# Patient Record
Sex: Female | Born: 1955 | Race: White | Hispanic: No | Smoking: Never smoker
Health system: Southern US, Community
[De-identification: ages and names within clinical notes are randomized; demographics above are authoritative.]

## PROBLEM LIST (undated history)

## (undated) DIAGNOSIS — J45909 Unspecified asthma, uncomplicated: Secondary | ICD-10-CM

## (undated) DIAGNOSIS — M8580 Other specified disorders of bone density and structure, unspecified site: Secondary | ICD-10-CM

## (undated) DIAGNOSIS — C801 Malignant (primary) neoplasm, unspecified: Secondary | ICD-10-CM

## (undated) HISTORY — PX: BUNIONECTOMY: SHX129

## (undated) HISTORY — PX: MASTECTOMY: SHX3

## (undated) HISTORY — PX: ABDOMINOPLASTY: SUR9

---

## 2003-03-24 ENCOUNTER — Emergency Department (HOSPITAL_COMMUNITY): Admission: EM | Admit: 2003-03-24 | Discharge: 2003-03-25 | Payer: Self-pay | Admitting: Emergency Medicine

## 2004-06-02 ENCOUNTER — Other Ambulatory Visit: Admission: RE | Admit: 2004-06-02 | Discharge: 2004-06-02 | Payer: Self-pay | Admitting: Gynecology

## 2013-06-08 ENCOUNTER — Emergency Department (HOSPITAL_COMMUNITY)
Admission: EM | Admit: 2013-06-08 | Discharge: 2013-06-08 | Disposition: A | Discharge status: Home / Self Care | Payer: BC Managed Care – PPO | Attending: Emergency Medicine | Admitting: Emergency Medicine

## 2013-06-08 ENCOUNTER — Encounter (HOSPITAL_COMMUNITY): Payer: Self-pay | Admitting: Emergency Medicine

## 2013-06-08 DIAGNOSIS — R5381 Other malaise: Secondary | ICD-10-CM | POA: Insufficient documentation

## 2013-06-08 DIAGNOSIS — J45909 Unspecified asthma, uncomplicated: Secondary | ICD-10-CM | POA: Insufficient documentation

## 2013-06-08 DIAGNOSIS — Z79899 Other long term (current) drug therapy: Secondary | ICD-10-CM | POA: Insufficient documentation

## 2013-06-08 DIAGNOSIS — M25539 Pain in unspecified wrist: Secondary | ICD-10-CM | POA: Insufficient documentation

## 2013-06-08 DIAGNOSIS — R5383 Other fatigue: Secondary | ICD-10-CM | POA: Insufficient documentation

## 2013-06-08 DIAGNOSIS — Z853 Personal history of malignant neoplasm of breast: Secondary | ICD-10-CM | POA: Insufficient documentation

## 2013-06-08 HISTORY — DX: Malignant (primary) neoplasm, unspecified: C80.1

## 2013-06-08 HISTORY — DX: Unspecified asthma, uncomplicated: J45.909

## 2013-06-08 LAB — COMPREHENSIVE METABOLIC PANEL
Albumin: 4.2 g/dL (ref 3.5–5.2)
Alkaline Phosphatase: 67 U/L (ref 39–117)
BUN: 17 mg/dL (ref 6–23)
Chloride: 103 mEq/L (ref 96–112)
GFR calc Af Amer: 89 mL/min — ABNORMAL LOW (ref >90)
Glucose, Bld: 85 mg/dL (ref 70–99)
Potassium: 4.9 mEq/L (ref 3.5–5.1)
Total Bilirubin: 0.4 mg/dL (ref 0.3–1.2)

## 2013-06-08 LAB — CBC WITH DIFFERENTIAL/PLATELET
Hemoglobin: 15 g/dL (ref 12.0–15.0)
Lymphs Abs: 1.7 10*3/uL (ref 0.7–4.0)
Monocytes Relative: 9 % (ref 3–12)
Neutro Abs: 1.4 10*3/uL — ABNORMAL LOW (ref 1.7–7.7)
Neutrophils Relative %: 39 % — ABNORMAL LOW (ref 43–77)
RBC: 4.89 MIL/uL (ref 3.87–5.11)

## 2013-06-08 MED ORDER — AMOXICILLIN 500 MG PO CAPS: 500.0000 mg | ORAL_CAPSULE | Freq: Three times a day (TID) | ORAL | Status: DC

## 2013-06-08 MED ORDER — ONDANSETRON 8 MG PO TBDP
8.0000 mg | ORAL_TABLET | Freq: Three times a day (TID) | ORAL | Status: DC | PRN
Start: 2013-06-08 — End: 2019-09-23

## 2013-06-08 MED ORDER — DOXYCYCLINE HYCLATE 100 MG PO CAPS: 100.0000 mg | ORAL_CAPSULE | Freq: Two times a day (BID) | ORAL | Status: DC

## 2013-06-08 MED ORDER — ONDANSETRON 8 MG PO TBDP: 8.0000 mg | ORAL_TABLET | Freq: Three times a day (TID) | ORAL | Status: DC | PRN

## 2013-06-08 NOTE — ED Provider Notes (Signed)
CSN: [id number]     Arrival date & time 06/08/13  1228 History     First MD Initiated Contact with Patient 06/08/13 1402     No chief complaint on file. CC: Positive RMSF titre   (Consider location/radiation/quality/duration/timing/severity/associated sxs/prior Treatment) HPI Comments: Patient presents with complaint of fatigue and arthralgias for the past 5 weeks. Patient complains of mainly pain in her bilateral wrists when she wakes in the morning. She saw her primary care doctor was concerned about Lyme disease given the fact that she was recently in [place]. Patient did note a "bite mark" on her abdomen several weeks ago but never developed a rash. She never saw a tick. Patient had an equivocal titer for [facility] spotted fever. She had negative titers for Lyme and ehrlichiosis. Patient was prescribed doxycycline 2 days ago took 1 dose but stopped after she became nauseous. No fever. The onset of this condition was gradual. The course is constant. Aggravating factors: none. Alleviating factors: none.    The history is provided by the patient.    Past Medical History  Diagnosis Date  . Cancer     breast ca  . Asthma     exercised induced   Past Surgical History  Procedure Laterality Date  . Mastectomy     No family history on file. History  Substance Use Topics  . Smoking status: Never Smoker   . Smokeless tobacco: Not on file  . Alcohol Use: Yes   OB History   Grav Para Term Preterm Abortions TAB SAB Ect Mult Living                 Review of Systems  Constitutional: Positive for fatigue. Negative for fever.  HENT: Negative for sore throat and rhinorrhea.   Eyes: Negative for redness.  Respiratory: Negative for cough.   Cardiovascular: Negative for chest pain.  Gastrointestinal: Negative for nausea, vomiting, abdominal pain and diarrhea.  Genitourinary: Negative for dysuria.  Musculoskeletal: Positive for arthralgias. Negative for myalgias.  Skin: Negative  for rash.  Neurological: Negative for headaches.    Allergies  Doxycycline and Percocet  Home Medications   Current Outpatient Rx  Name  Route  Sig  Dispense  Refill  . anastrozole (ARIMIDEX) 1 MG tablet   Oral   Take 1 mg by mouth daily.          BP 132/87  Pulse 75  Temp(Src) 97.6 F (36.4 C) (Oral)  Resp 99  SpO2 99% Physical Exam  Nursing note and vitals reviewed. Constitutional: She appears well-developed and well-nourished.  HENT:  Head: Normocephalic and atraumatic.  Eyes: Conjunctivae are normal. Right eye exhibits no discharge. Left eye exhibits no discharge.  Neck: Normal range of motion. Neck supple.  Cardiovascular: Normal rate, regular rhythm and normal heart sounds.   Pulmonary/Chest: Effort normal and breath sounds normal.  Abdominal: Soft. There is no tenderness.  Musculoskeletal:  Full ROM in hands and wrists with mild pain with movement.   Neurological: She is alert.  Skin: Skin is warm and dry.  No rash.  Psychiatric: She has a normal mood and affect.    ED Course   Procedures (including critical care time)  Labs Reviewed  CBC WITH DIFFERENTIAL - Abnormal; Notable for the following:    WBC 3.6 (*)    Neutrophils Relative % 39 (*)    Neutro Abs 1.4 (*)    Lymphocytes Relative 47 (*)    All other components within normal limits  COMPREHENSIVE METABOLIC  PANEL - Abnormal; Notable for the following:    GFR calc non Af Amer 77 (*)    GFR calc Af Amer 89 (*)    All other components within normal limits   No results found. 1. Fatigue   2. Wrist pain, unspecified laterality     2:49 PM Patient seen and examined. Pt d/w Dr. Karma Ganja.   Vital signs reviewed and are as follows: Filed Vitals:   06/08/13 1236  BP: 132/87  Pulse: 75  Temp: 97.6 F (36.4 C)  Resp: 99   Discussed need for PCP f/u. Informed of lab results.   Discussed treatment of doxycycline for 7 days total (cover for RMSF) vs 28 days for Lyme. She wants to treat for 28  days. She will f/u with PCP, possible rheumatologist.   Patient urged to return with worsening symptoms or other concerns. Patient verbalized understanding and agrees with plan.     MDM  RMSF titre IgM 0.94, equivocal. No rash or active disease other than joint pains. She has been in [place] so Lyme is possible. She wants to treat for Lyme even with neg western blot knowing that could be false neg. She wants to try taking doxy with zofran. I also provided 30 day rx amox if she cannot tolerate with zofran. Pt appears well, non-toxic, appropriate for d/c.    Renne Crigler, PA-C 06/08/13 1546

## 2013-06-08 NOTE — ED Notes (Addendum)
Aches in hands and feet x a 5 of weeks ago saw a bite mark around 70 of July. Went to dr [facility] 2 days ago and had [place] spotted fever titer drawn and started on doxy took one. and today  They called and told gher it was positive Had bouts of vomiting has been weak and tired alot

## 2013-06-08 NOTE — ED Provider Notes (Signed)
Medical screening examination/treatment/procedure(s) were performed by non-physician practitioner and as supervising physician I was immediately available for consultation/collaboration.  Ethelda Chick, MD 06/08/13 [phone]

## 2018-11-02 HISTORY — PX: KNEE ARTHROSCOPY W/ MENISCAL REPAIR: SHX1877

## 2019-09-23 ENCOUNTER — Other Ambulatory Visit: Payer: Self-pay

## 2019-09-23 ENCOUNTER — Emergency Department (HOSPITAL_COMMUNITY)
Admission: EM | Admit: 2019-09-23 | Discharge: 2019-09-23 | Disposition: A | Discharge status: Home / Self Care | Payer: 59 | Attending: Emergency Medicine | Admitting: Emergency Medicine

## 2019-09-23 ENCOUNTER — Emergency Department (HOSPITAL_COMMUNITY): Payer: 59

## 2019-09-23 ENCOUNTER — Encounter (HOSPITAL_COMMUNITY): Payer: Self-pay | Admitting: Obstetrics and Gynecology

## 2019-09-23 DIAGNOSIS — Y939 Activity, unspecified: Secondary | ICD-10-CM | POA: Insufficient documentation

## 2019-09-23 DIAGNOSIS — J45909 Unspecified asthma, uncomplicated: Secondary | ICD-10-CM | POA: Insufficient documentation

## 2019-09-23 DIAGNOSIS — S199XXA Unspecified injury of neck, initial encounter: Secondary | ICD-10-CM | POA: Diagnosis present

## 2019-09-23 DIAGNOSIS — G44301 Post-traumatic headache, unspecified, intractable: Secondary | ICD-10-CM | POA: Diagnosis not present

## 2019-09-23 DIAGNOSIS — Z79899 Other long term (current) drug therapy: Secondary | ICD-10-CM | POA: Insufficient documentation

## 2019-09-23 DIAGNOSIS — S161XXA Strain of muscle, fascia and tendon at neck level, initial encounter: Secondary | ICD-10-CM | POA: Diagnosis not present

## 2019-09-23 DIAGNOSIS — Y92410 Unspecified street and highway as the place of occurrence of the external cause: Secondary | ICD-10-CM | POA: Diagnosis not present

## 2019-09-23 DIAGNOSIS — Y999 Unspecified external cause status: Secondary | ICD-10-CM | POA: Insufficient documentation

## 2019-09-23 HISTORY — DX: Other specified disorders of bone density and structure, unspecified site: M85.80

## 2019-09-23 MED ORDER — IBUPROFEN 200 MG PO TABS
600.0000 mg | ORAL_TABLET | Freq: Once | ORAL | Status: AC
  Administered 2019-09-23: 11:00:00 600 mg via ORAL
  Filled 2019-09-23: qty 3

## 2019-09-23 MED ORDER — METHOCARBAMOL 500 MG PO TABS: 500.0000 mg | ORAL_TABLET | Freq: Two times a day (BID) | ORAL | 0 refills | Status: DC
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
## 2022-03-11 LAB — COLOGUARD: COLOGUARD: NEGATIVE

## 2022-03-16 ENCOUNTER — Other Ambulatory Visit: Payer: Self-pay | Admitting: Internal Medicine

## 2022-03-16 DIAGNOSIS — M85859 Other specified disorders of bone density and structure, unspecified thigh: Secondary | ICD-10-CM

## 2022-09-10 ENCOUNTER — Ambulatory Visit
Admission: RE | Admit: 2022-09-10 | Discharge: 2022-09-10 | Disposition: A | Discharge status: Home / Self Care | Payer: Medicare Other | Source: Ambulatory Visit | Attending: Internal Medicine | Admitting: Internal Medicine

## 2022-09-10 DIAGNOSIS — M85859 Other specified disorders of bone density and structure, unspecified thigh: Secondary | ICD-10-CM

## 2022-10-28 NOTE — Progress Notes (Deleted)
66 y.o. No obstetric history on file. Married Caucasian female here for NEW GYN/ annual exam.    PCP:     No LMP recorded. Patient is postmenopausal.           Sexually active: {yes no:314532}  The current method of family planning is post menopausal status.    Exercising: {yes no:314532}  {types:19826} Smoker:  {YES NO:22349}  Health Maintenance: Pap:  *** History of abnormal Pap:  {YES NO:22349} MMG:  *** Colonoscopy:  *** BMD:   09/10/22  Result  osteopenic TDaP:  *** Gardasil:   {YES NO:22349} HIV: Hep C: Screening Labs:  Hb today: ***, Urine today: ***   reports that she has never smoked. She does not have any smokeless tobacco history on file. She reports current alcohol use of about 7.0 standard drinks of alcohol per week. She reports that she does not currently use drugs.  Past Medical History:  Diagnosis Date   Asthma    exercised induced   Cancer ([place])    breast ca   Osteopenia     Past Surgical History:  Procedure Laterality Date   MASTECTOMY      Current Outpatient Medications  Medication Sig Dispense Refill   methocarbamol (ROBAXIN) 500 MG tablet Take 1 tablet (500 mg total) by mouth 2 (two) times daily. 20 tablet 0   No current facility-administered medications for this visit.    No family history on file.  Review of Systems  Exam:   There were no vitals taken for this visit.    General appearance: alert, cooperative and appears stated age Head: normocephalic, without obvious abnormality, atraumatic Neck: no adenopathy, supple, symmetrical, trachea midline and thyroid normal to inspection and palpation Lungs: clear to auscultation bilaterally Breasts: normal appearance, no masses or tenderness, No nipple retraction or dimpling, No nipple discharge or bleeding, No axillary adenopathy Heart: regular rate and rhythm Abdomen: soft, non-tender; no masses, no organomegaly Extremities: extremities normal, atraumatic, no cyanosis or edema Skin: skin  color, texture, turgor normal. No rashes or lesions Lymph nodes: cervical, supraclavicular, and axillary nodes normal. Neurologic: grossly normal  Pelvic: External genitalia:  no lesions              No abnormal inguinal nodes palpated.              Urethra:  normal appearing urethra with no masses, tenderness or lesions              Bartholins and Skenes: normal                 Vagina: normal appearing vagina with normal color and discharge, no lesions              Cervix: no lesions              Pap taken: {yes no:314532} Bimanual Exam:  Uterus:  normal size, contour, position, consistency, mobility, non-tender              Adnexa: no mass, fullness, tenderness              Rectal exam: {yes no:314532}.  Confirms.              Anus:  normal sphincter tone, no lesions  Chaperone was present for exam:  ***  Assessment:   Well woman visit with gynecologic exam.   Plan: Mammogram screening discussed. Self breast awareness reviewed. Pap and HR HPV as above. Guidelines for Calcium, Vitamin D, regular exercise program including  cardiovascular and weight bearing exercise.   Follow up annually and prn.   Additional counseling given.  {yes [id number]. _______ minutes face to face time of which over 50% was spent in counseling.    After visit summary provided.

## 2022-11-10 ENCOUNTER — Encounter: Payer: Medicare Other | Admitting: Obstetrics and Gynecology

## 2022-11-19 NOTE — Progress Notes (Unsigned)
67 y.o. No obstetric history on file. Married Caucasian female here for NEW GYN/annual exam.  Wants to discuss estrogen creams and recurrent UTI's. Has discomfort in her pelvis with this occurrences.  No dysuria and no hematuria.  Using probiotic and product like D-Mannose. Also having yeast infection.   New sexual partner after 12 years.  Having UTIs now. Well  Hx breast cancer.   Using vaginal lubricants with intercourse.   Interested in HRT.   Retired.  Has grandchildren in [place].   PCP:   Mickel Baas While, MD  No LMP recorded. Patient is postmenopausal.           Sexually active: Yes.    The current method of family planning is post menopausal status.    Exercising: Yes.     High intensity workouts, bike, hiking, walking  6x a week Smoker:  no  Health Maintenance: Pap:  2023 per pt History of abnormal Pap:  no MMG:  2023 per pt, [facility] the [facility] Colonoscopy:  n/a, cologuard 2023 BMD:   09/10/22  Result  osteopenic TDaP:  unsure, getting it soon for travel Gardasil:   no Screening Labs:  PCP   reports that she has never smoked. She does not have any smokeless tobacco history on file. She reports current alcohol use of about 7.0 standard drinks of alcohol per week. She reports that she does not currently use drugs.  Past Medical History:  Diagnosis Date   Asthma    exercised induced   Cancer ([place])    breast ca   Osteopenia     Past Surgical History:  Procedure Laterality Date   BUNIONECTOMY     2 of them   KNEE ARTHROSCOPY W/ MENISCAL REPAIR  2020   MASTECTOMY      Current Outpatient Medications  Medication Sig Dispense Refill   Probiotic Product (PROBIOTIC PO) Take by mouth.     rosuvastatin (CRESTOR) 10 MG tablet Take 10 mg by mouth at bedtime.     No current facility-administered medications for this visit.    Family History  Problem Relation Age of Onset   Breast cancer Maternal Aunt     Review of Systems  SEE HPI.    Exam:   BP 116/72 (BP Location: Left Arm, Patient Position: Sitting, Cuff Size: Normal)   Pulse 64   Ht '5\' 2"'$  (1.575 m)   Wt 127 lb (57.6 kg)   SpO2 100%   BMI 23.23 kg/m     General appearance: alert, cooperative and appears stated age Head: normocephalic, without obvious abnormality, atraumatic Neck: no adenopathy, supple, symmetrical, trachea midline and thyroid normal to inspection and palpation Lungs: clear to auscultation bilaterally Breasts: normal appearance, no masses or tenderness, No nipple retraction or dimpling, No nipple discharge or bleeding, No axillary adenopathy Heart: regular rate and rhythm Abdomen: soft, non-tender; no masses, no organomegaly Extremities: extremities normal, atraumatic, no cyanosis or edema Skin: skin color, texture, turgor normal. No rashes or lesions Lymph nodes: cervical, supraclavicular, and axillary nodes normal. Neurologic: grossly normal  Pelvic: External genitalia:  slits between labia majora and minora.               No abnormal inguinal nodes palpated.              Urethra:  normal appearing urethra with no masses, tenderness or lesions              Bartholins and Skenes: normal  Vagina: normal appearing vagina with normal color and discharge, no lesions              Cervix: no lesions              Pap taken: no Bimanual Exam:  Uterus:  normal size, contour, position, consistency, mobility, non-tender              Adnexa: no mass, fullness, tenderness              Rectal exam: yes.  Confirms.              Anus:  normal sphincter tone, no lesions  Chaperone was present for exam:  Raquel Sarna  Assessment:   Well woman visit with gynecologic exam. Hx breast cancer.  Post coital UTI.  STD screening.   Plan: Mammogram screening discussed. Self breast awareness reviewed. Pap and HR HPV not indicated.  Guidelines for Calcium, Vitamin D, regular exercise program including cardiovascular and weight bearing exercise.FU in  June for recheck.   Follow up annually and prn.   Additional counseling given.  {yes [id number]. _______ minutes face to face time of which over 50% was spent in counseling.    After visit summary provided.

## 2022-12-01 ENCOUNTER — Other Ambulatory Visit (HOSPITAL_COMMUNITY)
Admission: RE | Admit: 2022-12-01 | Discharge: 2022-12-01 | Disposition: A | Discharge status: Home / Self Care | Payer: Medicare Other | Source: Ambulatory Visit | Attending: Obstetrics and Gynecology | Admitting: Obstetrics and Gynecology

## 2022-12-01 ENCOUNTER — Encounter: Payer: Self-pay | Admitting: Obstetrics and Gynecology

## 2022-12-01 ENCOUNTER — Ambulatory Visit (INDEPENDENT_AMBULATORY_CARE_PROVIDER_SITE_OTHER): Payer: Medicare Other | Admitting: Obstetrics and Gynecology

## 2022-12-01 VITALS — BP 116/72 | HR 64 | Ht 62.0 in | Wt 127.0 lb

## 2022-12-01 DIAGNOSIS — Z9189 Other specified personal risk factors, not elsewhere classified: Secondary | ICD-10-CM

## 2022-12-01 DIAGNOSIS — Z113 Encounter for screening for infections with a predominantly sexual mode of transmission: Secondary | ICD-10-CM | POA: Insufficient documentation

## 2022-12-01 DIAGNOSIS — Z87440 Personal history of urinary (tract) infections: Secondary | ICD-10-CM

## 2022-12-01 DIAGNOSIS — Z01419 Encounter for gynecological examination (general) (routine) without abnormal findings: Secondary | ICD-10-CM

## 2022-12-01 DIAGNOSIS — Z853 Personal history of malignant neoplasm of breast: Secondary | ICD-10-CM

## 2022-12-01 MED ORDER — NITROFURANTOIN MACROCRYSTAL 100 MG PO CAPS: ORAL_CAPSULE | ORAL | 3 refills | Status: DC

## 2022-12-01 NOTE — Patient Instructions (Signed)

## 2022-12-02 ENCOUNTER — Encounter: Payer: Self-pay | Admitting: Obstetrics and Gynecology

## 2022-12-02 LAB — CERVICOVAGINAL ANCILLARY ONLY
Chlamydia: NEGATIVE
Comment: NEGATIVE
Comment: NEGATIVE
Comment: NORMAL
Neisseria Gonorrhea: NEGATIVE
Trichomonas: NEGATIVE

## 2022-12-23 ENCOUNTER — Encounter: Payer: Self-pay | Admitting: Internal Medicine

## 2023-03-22 NOTE — Progress Notes (Signed)
GYNECOLOGY  VISIT   HPI: 67 y.o.   Married  Caucasian  female   G2P0 with No LMP recorded. Patient is postmenopausal.   here for 6 mo recheck for recurrent UTI, Macrobid working well for post coital UTI prevention.  She is pleased with her treatment option.   No problems tolerating the Macrobid.   She stopped the AZO and D-Mannose.   Vulvar irritation has resolved.  She states she had shaved prior to her last office visit.  She is trying to get her mammogram scheduled and having difficulty getting an appointment.   Spending summer time with grandchildren.   GYNECOLOGIC HISTORY: No LMP recorded. Patient is postmenopausal. Contraception:  PMP Menopausal hormone therapy:  n/a Last mammogram:   2023 per pt, [facility]  Last pap smear:   2023 per pt        OB History     Gravida  2   Para      Term      Preterm      AB      Living  2      SAB      IAB      Ectopic      Multiple      Live Births  2              There are no problems to display for this patient.   Past Medical History:  Diagnosis Date   Asthma    exercised induced   Cancer ([facility])    breast cancer - right mastectomy with reconstruction   Osteopenia     Past Surgical History:  Procedure Laterality Date   BUNIONECTOMY     2 of them   KNEE ARTHROSCOPY W/ MENISCAL REPAIR  2020   MASTECTOMY Right    with reconstruction    Current Outpatient Medications  Medication Sig Dispense Refill   nitrofurantoin (MACRODANTIN) 100 MG capsule Take one capsule by mouth with intercourse. 30 capsule 3   rosuvastatin (CRESTOR) 10 MG tablet Take 10 mg by mouth at bedtime.     No current facility-administered medications for this visit.     ALLERGIES: Doxycycline and Percocet [oxycodone-acetaminophen]  Family History  Problem Relation Age of Onset   Breast cancer Maternal Aunt     Social History   Socioeconomic History   Marital status: Married     Spouse name: Not on file   Number of children: Not on file   Years of education: Not on file   Highest education level: Not on file  Occupational History   Not on file  Tobacco Use   Smoking status: Never   Smokeless tobacco: Not on file  Substance and Sexual Activity   Alcohol use: Yes    Alcohol/week: 7.0 standard drinks of alcohol    Types: 7 Glasses of wine per week   Drug use: Not Currently   Sexual activity: Yes    Birth control/protection: Post-menopausal    Comment: <5 sexual partners, < 42 y/o, yes STD, no abnormal pap  Other Topics Concern   Not on file  Social History Narrative   Not on file   Social Determinants of Health   Financial Resource Strain: Not on file  Food Insecurity: Not on file  Transportation Needs: Not on file  Physical Activity: Not on file  Stress: Not on file  Social Connections: Not on file  Intimate Partner Violence: Not on file  Review of Systems  All other systems reviewed and are negative.   PHYSICAL EXAMINATION:    BP 102/62 (BP Location: Right Arm, Patient Position: Sitting)   Pulse (!) 58   Resp 18   SpO2 98%     General appearance: alert, cooperative and appears stated age  ASSESSMENT  Hx breast cancer.  Status post right mastectomy with reconstruction.  Breast cancer screening.  Post coital UTI.  Using Macrobid.  PLAN  OK to refill Macrobid until her office visit is due in January, 2025 if she calls for a refill.  We discussed mammogram centers in [place].  Facility information given.  FU in January, 2025 for office visit.    20 min  total time was spent for this patient encounter, including preparation, face-to-face counseling with the patient, coordination of care, and documentation of the encounter.

## 2023-04-05 ENCOUNTER — Encounter: Payer: Self-pay | Admitting: Obstetrics and Gynecology

## 2023-04-05 ENCOUNTER — Ambulatory Visit (INDEPENDENT_AMBULATORY_CARE_PROVIDER_SITE_OTHER): Payer: Medicare Other | Admitting: Obstetrics and Gynecology

## 2023-04-05 VITALS — BP 102/62 | HR 58 | Resp 18

## 2023-04-05 DIAGNOSIS — N390 Urinary tract infection, site not specified: Secondary | ICD-10-CM

## 2023-04-05 DIAGNOSIS — Z853 Personal history of malignant neoplasm of breast: Secondary | ICD-10-CM | POA: Diagnosis not present

## 2023-10-08 ENCOUNTER — Other Ambulatory Visit: Payer: Self-pay | Admitting: Obstetrics and Gynecology

## 2023-10-08 DIAGNOSIS — Z1231 Encounter for screening mammogram for malignant neoplasm of breast: Secondary | ICD-10-CM

## 2023-10-14 ENCOUNTER — Ambulatory Visit
Admission: RE | Admit: 2023-10-14 | Discharge: 2023-10-14 | Disposition: A | Discharge status: Home / Self Care | Payer: Medicare Other | Source: Ambulatory Visit | Attending: Obstetrics and Gynecology | Admitting: Obstetrics and Gynecology

## 2023-10-14 DIAGNOSIS — Z1231 Encounter for screening mammogram for malignant neoplasm of breast: Secondary | ICD-10-CM

## 2023-12-30 NOTE — Progress Notes (Unsigned)
 68 y.o. G79P0 Divorced Caucasian female here for a breast and pelvic exam. Pt reports urinary urgency/frequency. Reports taking macrobid since feeling sxs but no relief yet.  Symptoms started 3 days ago.   Intercourse preceded this infection.   No blood in the urine.  Has suprapubic tenderness. Voiding frequent small amounts.   No other UTIs in the last year.   Some weight gain, 6 pounds in one year.  Working out 5 days a week.  Eating out more.   PHQ2-0  The patient is also followed for ***.  PCP: Patient, No Pcp Per - Dr. Nadene Rubins.  No LMP recorded. Patient is postmenopausal.           Sexually active: Yes.    The current method of family planning is post menopausal status.    Menopausal hormone therapy:  n/a Exercising: Yes.     6-7 days a week, weights and cardio Smoker:  no  OB History     Gravida  2   Para      Term      Preterm      AB      Living  2      SAB      IAB      Ectopic      Multiple      Live Births  2           HEALTH MAINTENANCE: Last 2 paps: 2023 per pt. History of abnormal Pap or positive HPV:  no Mammogram: 10/14/2023 neg birads 1, Cat C  Colonoscopy:  cologuard 2023 Bone Density:  09/10/22  Result  osteopenia   Immunization History  Administered Date(s) Administered   Unspecified SARS-COV-2 Vaccination 01/01/2020, 02/01/2020      reports that she has never smoked. She does not have any smokeless tobacco history on file. She reports current alcohol use of about 7.0 standard drinks of alcohol per week. She reports that she does not currently use drugs.  Past Medical History:  Diagnosis Date   Asthma    exercised induced   Cancer ([facility])    breast cancer - right mastectomy with reconstruction   Osteopenia     Past Surgical History:  Procedure Laterality Date   ABDOMINOPLASTY     BUNIONECTOMY     2 of them   KNEE ARTHROSCOPY W/ MENISCAL REPAIR  2020   MASTECTOMY Right    with reconstruction    Current  Outpatient Medications  Medication Sig Dispense Refill   rosuvastatin (CRESTOR) 10 MG tablet Take 10 mg by mouth at bedtime.     nitrofurantoin (MACRODANTIN) 100 MG capsule Take one capsule by mouth with intercourse. 30 capsule 3   No current facility-administered medications for this visit.    ALLERGIES: Percocet [oxycodone-acetaminophen]  Family History  Problem Relation Age of Onset   Breast cancer Maternal Aunt     Review of Systems  Genitourinary:  Positive for urgency.  All other systems reviewed and are negative.   PHYSICAL EXAM:  BP 112/78   Pulse 74   Ht 5' 2.25" (1.581 m)   Wt 130 lb (59 kg)   SpO2 96%   BMI 23.59 kg/m     General appearance: alert, cooperative and appears stated age Head: normocephalic, without obvious abnormality, atraumatic Neck: no adenopathy, supple, symmetrical, trachea midline and thyroid normal to inspection and palpation Lungs: clear to auscultation bilaterally Breasts: right breast absent, reconstruction present.  No masses.  No axillary adenopathy.  Left breast -  normal appearance, no masses or tenderness, No nipple retraction or dimpling, No nipple discharge or bleeding, No axillary adenopathy Heart: regular rate and rhythm Abdomen: soft, non-tender; no masses, no organomegaly Extremities: extremities normal, atraumatic, no cyanosis or edema Skin: skin color, texture, turgor normal. No rashes or lesions Lymph nodes: cervical, supraclavicular, and axillary nodes normal. Neurologic: grossly normal  Pelvic: External genitalia:  areas of flat hypopigmentation scattered on the vulva.  No symptoms per patient.               No abnormal inguinal nodes palpated.              Urethra:  normal appearing urethra with no masses, tenderness or lesions              Bartholins and Skenes: normal                 Vagina: normal appearing vagina with normal color and discharge, no lesions              Cervix: no lesions              Pap taken:  yes Bimanual Exam:  Uterus:  normal size, contour, position, consistency, mobility, non-tender              Adnexa: no mass, fullness, tenderness              Rectal exam: yes.  Confirms.              Anus:  normal sphincter tone, no lesions  Chaperone was present for exam:  Rosette Reveal, CMA  ASSESSMENT: Encounter for breast and pelvic exam.  Hx breast cancer.  Status post right mastectomy with reconstruction.  PHQ2:  0  PLAN: Mammogram screening discussed. Self breast awareness reviewed. Pap and HRV collected:  yes Guidelines for Calcium, Vitamin D, regular exercise program including cardiovascular and weight bearing exercise. Medication refills:  NA Patient agrees to HIV, hep C, and syphilis screening.  Declines biopsy of the vulva. Declines vaginal and cervical testing.  BMD in 09/2024. {LABS (Optional):23779} Follow up:  ***   Additional counseling given.  {yes [id number]. ***  total time was spent for this patient encounter, including preparation, face-to-face counseling with the patient, coordination of care, and documentation of the encounter in addition to doing the breast and pelvic exam.

## 2024-01-13 ENCOUNTER — Encounter: Payer: Self-pay | Admitting: Obstetrics and Gynecology

## 2024-01-13 ENCOUNTER — Other Ambulatory Visit (HOSPITAL_COMMUNITY)
Admission: RE | Admit: 2024-01-13 | Discharge: 2024-01-13 | Disposition: A | Discharge status: Home / Self Care | Source: Ambulatory Visit | Attending: Obstetrics and Gynecology | Admitting: Obstetrics and Gynecology

## 2024-01-13 ENCOUNTER — Ambulatory Visit: Payer: Medicare Other | Admitting: Obstetrics and Gynecology

## 2024-01-13 VITALS — BP 112/78 | HR 74 | Ht 62.25 in | Wt 130.0 lb

## 2024-01-13 DIAGNOSIS — R350 Frequency of micturition: Secondary | ICD-10-CM | POA: Diagnosis not present

## 2024-01-13 DIAGNOSIS — Z114 Encounter for screening for human immunodeficiency virus [HIV]: Secondary | ICD-10-CM

## 2024-01-13 DIAGNOSIS — Z8742 Personal history of other diseases of the female genital tract: Secondary | ICD-10-CM

## 2024-01-13 DIAGNOSIS — Z1331 Encounter for screening for depression: Secondary | ICD-10-CM

## 2024-01-13 DIAGNOSIS — Z124 Encounter for screening for malignant neoplasm of cervix: Secondary | ICD-10-CM | POA: Diagnosis present

## 2024-01-13 DIAGNOSIS — Z9289 Personal history of other medical treatment: Secondary | ICD-10-CM

## 2024-01-13 DIAGNOSIS — Z87440 Personal history of urinary (tract) infections: Secondary | ICD-10-CM

## 2024-01-13 DIAGNOSIS — Z1159 Encounter for screening for other viral diseases: Secondary | ICD-10-CM

## 2024-01-13 DIAGNOSIS — Z113 Encounter for screening for infections with a predominantly sexual mode of transmission: Secondary | ICD-10-CM

## 2024-01-13 DIAGNOSIS — Z1151 Encounter for screening for human papillomavirus (HPV): Secondary | ICD-10-CM | POA: Diagnosis not present

## 2024-01-13 DIAGNOSIS — Z780 Asymptomatic menopausal state: Secondary | ICD-10-CM

## 2024-01-13 DIAGNOSIS — Z01419 Encounter for gynecological examination (general) (routine) without abnormal findings: Secondary | ICD-10-CM

## 2024-01-13 DIAGNOSIS — R3915 Urgency of urination: Secondary | ICD-10-CM

## 2024-01-13 DIAGNOSIS — Z9189 Other specified personal risk factors, not elsewhere classified: Secondary | ICD-10-CM

## 2024-01-13 DIAGNOSIS — M8580 Other specified disorders of bone density and structure, unspecified site: Secondary | ICD-10-CM

## 2024-01-13 LAB — URINALYSIS, COMPLETE W/RFL CULTURE
Bacteria, UA: NONE SEEN /HPF
Bilirubin Urine: NEGATIVE
Glucose, UA: NEGATIVE
Hgb urine dipstick: NEGATIVE
Hyaline Cast: NONE SEEN /LPF
Leukocyte Esterase: NEGATIVE
Nitrites, Initial: NEGATIVE
Protein, ur: NEGATIVE
RBC / HPF: NONE SEEN /HPF (ref 0–2)
Specific Gravity, Urine: 1.015 (ref 1.001–1.035)
WBC, UA: NONE SEEN /HPF (ref 0–5)
pH: 5.5 (ref 5.0–8.0)

## 2024-01-13 LAB — NO CULTURE INDICATED

## 2024-01-13 MED ORDER — NITROFURANTOIN MACROCRYSTAL 100 MG PO CAPS: ORAL_CAPSULE | ORAL | 3 refills | Status: DC

## 2024-01-14 ENCOUNTER — Other Ambulatory Visit: Payer: Self-pay | Admitting: Obstetrics and Gynecology

## 2024-01-14 DIAGNOSIS — Z1231 Encounter for screening mammogram for malignant neoplasm of breast: Secondary | ICD-10-CM

## 2024-01-14 LAB — RPR: RPR Ser Ql: NONREACTIVE

## 2024-01-14 LAB — HIV ANTIBODY (ROUTINE TESTING W REFLEX): HIV 1&2 Ab, 4th Generation: NONREACTIVE

## 2024-01-14 LAB — HEPATITIS C ANTIBODY: Hepatitis C Ab: NONREACTIVE

## 2024-01-14 NOTE — Patient Instructions (Signed)

## 2024-01-17 LAB — CYTOLOGY - PAP
Adequacy: ABSENT
Comment: NEGATIVE
Diagnosis: HIGH — AB
High risk HPV: POSITIVE — AB

## 2024-01-18 ENCOUNTER — Other Ambulatory Visit: Payer: Self-pay | Admitting: *Deleted

## 2024-01-18 DIAGNOSIS — R87810 Cervical high risk human papillomavirus (HPV) DNA test positive: Secondary | ICD-10-CM

## 2024-01-18 DIAGNOSIS — R87613 High grade squamous intraepithelial lesion on cytologic smear of cervix (HGSIL): Secondary | ICD-10-CM

## 2024-01-18 MED ORDER — METRONIDAZOLE 500 MG PO TABS: 500.0000 mg | ORAL_TABLET | Freq: Two times a day (BID) | ORAL | 0 refills | Status: AC

## 2024-01-19 ENCOUNTER — Other Ambulatory Visit: Payer: Self-pay | Admitting: Obstetrics and Gynecology

## 2024-01-19 MED ORDER — ALPRAZOLAM 0.25 MG PO TABS: ORAL_TABLET | ORAL | 0 refills | Status: DC

## 2024-01-19 NOTE — Progress Notes (Signed)
 Rx for Xanax to take pre-procedure.

## 2024-01-27 NOTE — Progress Notes (Signed)
 GYNECOLOGY  VISIT   HPI: 68 y.o.   Divorced  Caucasian female   G2P0 with No LMP recorded. Patient is postmenopausal.   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   Patient does request that I do a vulvar biopsy today if needed.  States she does not want to return for a separate appointment.    Took Xanax in preparation for the visit today.    States she is not feeling the effects of the Xanax.    She is asking questions about potential treatment.    GYNECOLOGIC HISTORY: No LMP recorded. Patient is postmenopausal. Contraception:  PMP Menopausal hormone therapy:  n/a Last 2 paps:  01/13/24 HSIL: HR HPV positive History of abnormal Pap or positive HPV:  no Mammogram:  10/14/2023 neg birads 1, Cat C         OB History     Gravida  2   Para      Term      Preterm      AB      Living  2      SAB      IAB      Ectopic      Multiple      Live Births  2              Patient Active Problem List   Diagnosis Date Noted   Recurrent UTI 04/05/2023   Personal history of breast cancer 04/05/2023    Past Medical History:  Diagnosis Date   Asthma    exercised induced   Cancer ([facility])    breast cancer - right mastectomy with reconstruction   Osteopenia     Past Surgical History:  Procedure Laterality Date   ABDOMINOPLASTY     BUNIONECTOMY     2 of them   KNEE ARTHROSCOPY W/ MENISCAL REPAIR  2020   MASTECTOMY Right    with reconstruction    Current Outpatient Medications  Medication Sig Dispense Refill   ALPRAZolam (XANAX) 0.25 MG tablet Take one tablet, 0.25 mg, by mouth one hour prior to procedure. 2 tablet 0   nitrofurantoin (MACRODANTIN) 100 MG capsule Take one capsule by mouth with intercourse. 30 capsule 3   rosuvastatin (CRESTOR) 10 MG tablet Take 10 mg by mouth at bedtime.     No current facility-administered medications for this visit.     ALLERGIES: Percocet [oxycodone-acetaminophen]  Family History  Problem  Relation Age of Onset   Breast cancer Maternal Aunt     Social History   Socioeconomic History   Marital status: Divorced    Spouse name: Not on file   Number of children: Not on file   Years of education: Not on file   Highest education level: Not on file  Occupational History   Not on file  Tobacco Use   Smoking status: Never   Smokeless tobacco: Not on file  Substance and Sexual Activity   Alcohol use: Yes    Alcohol/week: 7.0 standard drinks of alcohol    Types: 7 Glasses of wine per week   Drug use: Not Currently   Sexual activity: Yes    Birth control/protection: Post-menopausal    Comment: <5 sexual partners, >16 y/o, yes STI, no abnormal pap  Other Topics Concern   Not on file  Social History Narrative   Not on file   Social Drivers of Health   Financial Resource Strain: Not on file  Food Insecurity: Not  on file  Transportation Needs: Not on file  Physical Activity: Not on file  Stress: Not on file  Social Connections: Not on file  Intimate Partner Violence: Not on file    Review of Systems  All other systems reviewed and are negative.   PHYSICAL EXAMINATION:   BP 126/72 (BP Location: Left Arm, Patient Position: Sitting, Cuff Size: Small)   Pulse 78   Wt 130 lb (59 kg)   SpO2 98%   BMI 23.59 kg/m     General appearance: alert, cooperative and appears stated age   Colposcopy - cervix, vagina, and vulva.  Consent for procedure.  Time out done.  3% acetic acid used in vagina, on cervix, and on vulva. White light and green light filter used.  Colposcopy satisfactory:  Yes   _____          No    ___x__ Findings:  atrophy changes of the cervix and vagina.   Cervix:  cervical stenosis.  Raised acetowhite change at 6:00 and directly below the cervix.  Attempt to do ECC.  Os finder will not pass through os.  Hibiclens prep to cervix and tenaculum placed at 12:00.  Attempt to dilate with a tiny dilator, not successful, so this was discontinued.  Vagina:   thickened acetowhite changes of the right and left vaginal apices.  Vulva:  multiple flat white skin coloration change of the vulva and down to the left perianal region.  Biopsies:  biopsy cervix 6:00, biopsy right vaginal apex, biopsy left vaginal apex, biopsy right inferior labia majora.  All specimens to pathology separately.  The vulva was cleansed with Hibiclens. Local 1% lidocaine used lot [id number], exp Aug, 2026.  3 mm punch biopsy done.  Silver nitrate applied.  Minimal EBL.  No complications.    Chaperone was present for exam:   Edwin Dada, CMA  ASSESSMENT:  HGSIL pap.  Positive HR HPV.   PLAN:  Follow up biopsies.  Return for discussion of results and treatment.  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  Return in 2 weeks for discussion of results and planning.

## 2024-02-10 ENCOUNTER — Other Ambulatory Visit (HOSPITAL_COMMUNITY)
Admission: RE | Admit: 2024-02-10 | Discharge: 2024-02-10 | Disposition: A | Discharge status: Home / Self Care | Source: Ambulatory Visit | Attending: Obstetrics and Gynecology | Admitting: Obstetrics and Gynecology

## 2024-02-10 ENCOUNTER — Encounter: Payer: Self-pay | Admitting: Obstetrics and Gynecology

## 2024-02-10 ENCOUNTER — Ambulatory Visit (INDEPENDENT_AMBULATORY_CARE_PROVIDER_SITE_OTHER): Admitting: Obstetrics and Gynecology

## 2024-02-10 VITALS — BP 126/72 | HR 78 | Wt 130.0 lb

## 2024-02-10 DIAGNOSIS — R87810 Cervical high risk human papillomavirus (HPV) DNA test positive: Secondary | ICD-10-CM

## 2024-02-10 DIAGNOSIS — N882 Stricture and stenosis of cervix uteri: Secondary | ICD-10-CM

## 2024-02-10 DIAGNOSIS — R87613 High grade squamous intraepithelial lesion on cytologic smear of cervix (HGSIL): Secondary | ICD-10-CM | POA: Diagnosis present

## 2024-02-10 NOTE — Patient Instructions (Signed)
 Colposcopy, Care After  The following information offers guidance on how to care for yourself after your procedure. Your doctor may also give you more specific instructions. If you have problems or questions, contact your doctor. What can I expect after the procedure? If you did not have a sample of your tissue taken out (did not have a biopsy), you may only have some spotting of blood for a few days. You can go back to your normal activities. If you had a sample of your tissue taken out, it is common to have: Soreness and mild pain. These may last for a few days. Mild bleeding or fluid (discharge) coming from your vagina. The fluid will look dark and grainy. You may have this for a few days. The fluid may be caused by a liquid that was used during your procedure. You may need to wear a sanitary pad. Spotting of blood for at least 48 hours after the procedure. Follow these instructions at home: Medicines Take over-the-counter and prescription medicines only as told by your doctor. Ask your doctor what over-the-counter pain medicines and prescription medicines you can start taking again. This is very important if you take blood thinners. Activity For at least 3 days, or for as long as told by your doctor, avoid: Douching. Using tampons. Having sex. Return to your normal activities as told by your doctor. Ask your doctor what activities are safe for you. General instructions Ask your doctor if you may take baths, swim, or use a hot tub. You may take showers. If you use birth control (contraception), keep using it. Keep all follow-up visits. Contact a doctor if: You have a fever or chills. You faint or feel light-headed. Get help right away if: You bleed a lot from your vagina. A lot of bleeding means that the bleeding soaks through a pad in less than 1 hour. You have clumps of blood (blood clots) coming from your vagina. You have signs that could mean you have an infection. This may be  fluid coming from your vagina that is: Different than normal. Yellow. Bad-smelling. You have very bad pain or cramps in your lower belly that do not get better with medicine. Summary If you did not have a sample of your tissue taken out, you may only have some spotting of blood for a few days. You can go back to your normal activities. If you had a sample of your tissue taken out, it is common to have mild pain for a few days and spotting for 48 hours. Avoid douching, using tampons, and having sex for at least 3 days after the procedure or for as long as told. Get help right away if you have a lot of bleeding, very bad pain, or signs of infection. This information is not intended to replace advice given to you by your health care provider. Make sure you discuss any questions you have with your health care provider. Document Revised: 03/16/2021 Document Reviewed: 03/16/2021 Elsevier Patient Education  2024 [employer].

## 2024-02-14 LAB — SURGICAL PATHOLOGY

## 2024-02-15 ENCOUNTER — Encounter: Payer: Self-pay | Admitting: Obstetrics and Gynecology

## 2024-02-16 ENCOUNTER — Other Ambulatory Visit: Payer: Self-pay

## 2024-02-16 DIAGNOSIS — R87613 High grade squamous intraepithelial lesion on cytologic smear of cervix (HGSIL): Secondary | ICD-10-CM

## 2024-02-17 ENCOUNTER — Telehealth: Payer: Self-pay

## 2024-02-17 NOTE — Telephone Encounter (Signed)
 Spoke with [name] regarding her referral to GYN oncology. She has an appointment scheduled with Dr. Orvil Bland on 03/30/24 at 10:30. Patient agrees to date and time. She has been provided with office address and location. She is also aware of our mask and visitor policy. Patient verbalized understanding and will call with any questions.    Pt declined earlier appointment d/t [place]  [place] 2 [place].

## 2024-02-22 ENCOUNTER — Ambulatory Visit: Admitting: Obstetrics and Gynecology

## 2024-02-29 ENCOUNTER — Ambulatory Visit: Admitting: Obstetrics and Gynecology

## 2024-03-06 ENCOUNTER — Ambulatory Visit: Admitting: Obstetrics and Gynecology

## 2024-03-29 ENCOUNTER — Encounter: Payer: Self-pay | Admitting: Gynecologic Oncology

## 2024-03-29 NOTE — Progress Notes (Unsigned)
 GYNECOLOGIC ONCOLOGY NEW PATIENT CONSULTATION   Patient Name: Kristina Lin  Patient Age: 68 y.o. Date of Service: 03/30/24 Referring Provider: Greta Leatherwood, MD [address] [place],  [place] [address]   Primary Care Provider: Patient, No Pcp Per Consulting Provider: Wiley Hanger, MD   Assessment/Plan:  Postmenopausal patient with HPV related cervical, vaginal, and vulvar dysplasia.  Reviewed in detail her recent history including Pap test, HPV results, and multiple biopsies.  She has a history of normal Paps until this year.  Now multiple biopsies are showing high-grade dysplasia of the cervix, upper vagina, and vulva.  We discussed positive high risk HPV testing and that this may be an infection that has been dormant for years or decades.  Spent some time discussing pathogenesis of cervical, vaginal, and vulvar dysplasia, especially in the setting of HPV infection.  Discussed recommendation to treat high-grade dysplasia given increased risk of progression to cancer.  We reviewed options from a treatment standpoint including surgical (excision versus CO2 laser) and topical.  Given extent of findings on her exam and multifocal disease, I recommend that we proceed with surgical treatment using a combination of excision and CO2 laser.  We discussed the need for close surveillance even with adequate treatment now given the risk of recurrence of HPV related dysplasia.  We discussed tentative plan to proceed with surgical excision using a cold knife conization and post cone endocervical curettage.  To treat her vaginal dysplasia, we reviewed possible CO2 laser ablation versus several small vaginectomy's.  Given multifocal appearance of her overall small volume but diffuse vulvar disease, I recommended that we proceed with vulvar biopsies and CO2 laser ablation.  The patient would like to wait until least mid to late July.  She will be scheduled for surgery and return  within 1-2 weeks for preoperative appointment and perioperative teaching.  A copy of this note was sent to the patient's referring provider.   75 minutes of total time was spent for this patient encounter, including preparation, face-to-face counseling with the patient and coordination of care, and documentation of the encounter.  Wiley Hanger, MD  Division of Gynecologic Oncology  Department of Obstetrics and Gynecology  [facility]  ___________________________________________  Chief Complaint: Chief Complaint  Patient presents with   VAIN III    History of Present Illness:  Kristina Lin is a 68 y.o. y.o. female who is seen in consultation at the request of Emmette Harms* for an evaluation of high-grade cervical, vaginal, and vulvar dysplasia.  Reports normal pap yesterday, last pap had been in 2023. Pap performed on 01/13/24: HSIL, HR HPV + Colposcopy performed on 02/10/24: Multiple flat areas of leukoplakia along the vulva down to the left perianal region as well as acetowhite changes on the cervix and upper vagina. 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  Patient presents today with a close friend.  She notes overall doing well.  She denies any vaginal bleeding or discharge.  She reports normal bowel and bladder function.  She endorses a good appetite without nausea or emesis.  Denies any recent weight changes.  She endorses a history of an STD treated in 2012 shortly after her breast cancer treatment when she found out that her now ex-husband was having unprotected intercourse with other people.  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 Patient is postmenopausal.  Age at menarche: 32  Age at menopause: 15 Hx of HRT: denies Hx of STDs: yes Last pap: see HPI History of abnormal pap smears: see HPI  SCREENING STUDIES:  Last mammogram: 2024  Last bone mineral density: 2023  MEDICATIONS: Outpatient Encounter Medications as of 03/30/2024  Medication Sig   Ascorbic Acid (VITAMIN C) 100 MG tablet Take 100 mg by mouth daily.   LYSINE PO Take by mouth.   Magnesium 100 MG TABS Take by mouth.   nitrofurantoin  (MACRODANTIN ) 100 MG capsule Take one capsule by mouth with intercourse.   Probiotic Product (PROBIOTIC DAILY PO) Take by mouth.   rosuvastatin (CRESTOR) 10 MG tablet Take 10 mg by mouth at bedtime.   SELENIUM PO Take by mouth.   ALPRAZolam  (XANAX ) 0.25 MG tablet Take one tablet, 0.25 mg, by mouth one hour prior to procedure. (Patient not taking: Reported on 03/29/2024)   No facility-administered encounter medications on file as of 03/30/2024.    ALLERGIES:  Allergies  Allergen Reactions   Percocet [Oxycodone-Acetaminophen] Nausea And Vomiting     FAMILY HISTORY:  Family History  Problem Relation Age of Onset   Breast cancer Maternal Aunt    Prostate cancer Neg Hx    Colon cancer Neg Hx    Pancreatic cancer Neg Hx    Ovarian cancer Neg Hx    Endometrial cancer Neg Hx      SOCIAL HISTORY:  Social Connections: Not on file    REVIEW OF SYSTEMS:  Denies appetite changes, fevers, chills,  fatigue, unexplained weight changes. Denies hearing loss, neck lumps or masses, mouth sores, ringing in ears or voice changes. Denies cough or wheezing.  Denies shortness of breath. Denies chest pain or palpitations. Denies leg swelling. Denies abdominal distention, pain, blood in stools, constipation, diarrhea, nausea, vomiting, or early satiety. Denies pain with intercourse, dysuria, frequency, hematuria or incontinence. Denies hot flashes, pelvic pain, vaginal bleeding or vaginal discharge.   Denies joint pain, back pain or muscle pain/cramps. Denies itching, rash, or wounds. Denies dizziness, headaches, numbness or seizures. Denies swollen lymph nodes or glands, denies easy bruising or bleeding. Denies anxiety, depression, confusion, or decreased concentration.  Physical Exam:  Vital Signs for this encounter:  Blood pressure 118/75, pulse 68, temperature 98.2 F (36.8 C), temperature source Oral, resp. rate 16, height 5\' 2"  (1.575 m), weight 127 lb 9.6 oz (57.9 kg), SpO2 100%. Body mass index is 23.34 kg/m. General: Alert, oriented, no  acute distress.  HEENT: Normocephalic, atraumatic. Sclera anicteric.  Chest: Clear to auscultation bilaterally. No wheezes, rhonchi, or rales. Cardiovascular: Regular rate and rhythm, no murmurs, rubs, or gallops.  Abdomen: Normoactive bowel sounds. Soft, nondistended, nontender to palpation. No masses or hepatosplenomegaly appreciated. No palpable fluid wave.  Extremities: Grossly normal range of motion. Warm, well perfused. No edema bilaterally.  Skin: No rashes or lesions.  Lymphatics: No cervical, supraclavicular, or inguinal adenopathy.  GU:  Vulvar exam is notable for multiple small hypopigmented areas on the vulva including the upper right vulva, over the clitoral hood, and less so on the left upper vulva.  Some hypopigmentation versus leukoplakia involving the inferior aspect of bilateral labia as well as the perineum.                Bladder/urethra:  No lesions or masses, well supported bladder             Vagina: Moderately atrophic.  After application of Lugol's, there is decreased uptake along bilateral upper vaginal sidewalls at 3 and 9:00 measuring up to 2 cm.  There is also decreased uptake along the posterior vagina adjacent to the cervix from 5-7 o'clock.             Cervix: Normal appearing, atrophic.  Decreased uptake across much of the face of the cervix.  No visible lesions.             Uterus: Small, mobile, no parametrial involvement or nodularity.             Adnexa: No masses appreciated.  LABORATORY AND RADIOLOGIC DATA:  Outside medical records were reviewed to synthesize the above history, along with the history and physical obtained during the visit.   Lab Results  Component Value Date   WBC 3.6 (L) 06/08/2013   HGB 15.0 06/08/2013   HCT 44.0 06/08/2013   PLT 220 06/08/2013   GLUCOSE 85 06/08/2013   ALT 17 06/08/2013   AST 24 06/08/2013   NA 140 06/08/2013   K 4.9 06/08/2013   CL 103 06/08/2013   CREATININE 0.83 06/08/2013   BUN 17 06/08/2013   CO2 24 06/08/2013

## 2024-03-30 ENCOUNTER — Encounter: Payer: Self-pay | Admitting: Gynecologic Oncology

## 2024-03-30 ENCOUNTER — Inpatient Hospital Stay: Attending: Gynecologic Oncology | Admitting: Gynecologic Oncology

## 2024-03-30 VITALS — BP 118/75 | HR 68 | Temp 98.2°F | Resp 16 | Ht 62.0 in | Wt 127.6 lb

## 2024-03-30 DIAGNOSIS — D072 Carcinoma in situ of vagina: Secondary | ICD-10-CM | POA: Diagnosis not present

## 2024-03-30 DIAGNOSIS — B977 Papillomavirus as the cause of diseases classified elsewhere: Secondary | ICD-10-CM | POA: Diagnosis not present

## 2024-03-30 DIAGNOSIS — D069 Carcinoma in situ of cervix, unspecified: Secondary | ICD-10-CM

## 2024-03-30 DIAGNOSIS — Z853 Personal history of malignant neoplasm of breast: Secondary | ICD-10-CM | POA: Insufficient documentation

## 2024-03-30 DIAGNOSIS — Z9223 Personal history of estrogen therapy: Secondary | ICD-10-CM | POA: Insufficient documentation

## 2024-03-30 DIAGNOSIS — Z803 Family history of malignant neoplasm of breast: Secondary | ICD-10-CM | POA: Insufficient documentation

## 2024-03-30 DIAGNOSIS — Z9011 Acquired absence of right breast and nipple: Secondary | ICD-10-CM | POA: Insufficient documentation

## 2024-03-30 DIAGNOSIS — Z79899 Other long term (current) drug therapy: Secondary | ICD-10-CM | POA: Insufficient documentation

## 2024-03-30 DIAGNOSIS — D071 Carcinoma in situ of vulva: Secondary | ICD-10-CM

## 2024-03-30 NOTE — Patient Instructions (Signed)
 Preparing for your Surgery  Plan for surgery on May 24, 2024 with Dr. Wiley Hanger at [facility]. You will be scheduled for pelvic examination under anesthesia, cold knife conization of the cervix (cone shaped biopsy of the cervix), endocervical curettage (sampling from the inner canal of the cervix), vulvar laser vs wide local excision of the vulva and any other indicated procedures.  We will plan on giving you a vulvar after surgery kit including a donut pillow, topical lidocaine, peri bottle.  Pre-operative Testing -You will receive a phone call from presurgical testing at [facility] to discuss surgery instructions and arrange for lab work if needed.  -Bring your insurance card, copy of an advanced directive if applicable, medication list.  -You should not be taking blood thinners or aspirin at least ten days prior to surgery unless instructed by your surgeon.  -Do not take supplements such as fish oil (omega 3), red yeast rice, turmeric before your surgery. You want to avoid medications with aspirin in them including headache powders such as BC or Goody's), Excedrin migraine.  Day Before Surgery at Home -You will be advised you can have clear liquids up until 3 hours before your surgery.    Your role in recovery Your role is to become active as soon as directed by your doctor, while still giving yourself time to heal.  Rest when you feel tired. You will be asked to do the following in order to speed your recovery:  - Cough and breathe deeply. This helps to clear and expand your lungs and can prevent pneumonia after surgery.  - STAY ACTIVE WHEN YOU GET HOME. Do mild physical activity. Walking or moving your legs help your circulation and body functions return to normal. Do not try to get up or walk alone the first time after surgery.   -If you develop swelling on one leg or the other, pain in the back of your leg, redness/warmth in one of your legs, please call the  office or go to the Emergency Room to have a doppler to rule out a blood clot. For shortness of breath, chest pain-seek care in the Emergency Room as soon as possible. - Actively manage your pain. Managing your pain lets you move in comfort. We will ask you to rate your pain on a scale of zero to 10. It is your responsibility to tell your doctor or nurse where and how much you hurt so your pain can be treated.  Special Considerations -Your final pathology results from surgery should be available around one week after surgery and the results will be relayed to you when available.  -FMLA forms can be faxed to [phone] and please allow 5-7 business days for completion.  Pain Management After Surgery -You will be prescribed your pain medication and bowel regimen medications before surgery so that you can have these available when you are discharged from the hospital. The pain medication is for use ONLY AFTER surgery and a new prescription will not be given.   -Make sure that you have Tylenol and Ibuprofen  at home IF YOU ARE ABLE TO TAKE THESE MEDICATIONS to use on a regular basis after surgery for pain control. We recommend alternating the medications every hour to six hours since they work differently and are processed in the body differently for pain relief.  -Review the attached handout on narcotic use and their risks and side effects.   Bowel Regimen -You will be prescribed Sennakot-S to take nightly to prevent  constipation especially if you are taking the narcotic pain medication intermittently.  It is important to prevent constipation and drink adequate amounts of liquids. You can stop taking this medication when you are not taking pain medication and you are back on your normal bowel routine.  Risks of Surgery Risks of surgery are low but include bleeding, infection, damage to surrounding structures, re-operation, blood clots, and very rarely death.  AFTER SURGERY INSTRUCTIONS   Return  to work:  2 weeks if applicable with lifting restrictions   You may notice a piece of gauze like-material come out of the vagina over the next several days. This is NORMAL and is used during surgery to help stop bleeding at the cervix.    We recommend purchasing several bags of frozen green peas and dividing them into ziploc bags. You will want to keep these in the freezer and have them ready to use as ice packs to the vulvar incision. Once the ice pack is no longer cold, you can get another from the freezer. The frozen peas mold to your body better than a regular ice pack.   You can use topical lidocaine after surgery as a topical gel for pain relief.   Activity: 1. Be up and out of the bed during the day.  Take a nap if needed.  You may walk up steps but be careful and use the hand rail.  Stair climbing will tire you more than you think, you may need to stop part way and rest.    2. No lifting or straining for 4 weeks over 10 pounds. No pushing, pulling, straining for 4 weeks.   3. No driving for minimum 24 hours after surgery but usually it is longer when the following criteria have been met: Do not drive if you are taking narcotic pain medicine and make sure that your reaction time has returned.    4. You can shower as soon as the next day after surgery. Shower daily. No tub baths or submerging your body in water until cleared by your surgeon. If you have the soap that was given to you by pre-surgical testing that was used before surgery, you do not need to use it afterwards because this can irritate your incisions.    5. No sexual activity and nothing in the vagina for 4-6 weeks, until seen in the office.   6. You may experience vaginal spotting and discharge after surgery.  The spotting is normal but if you experience heavy bleeding, call our office.   7. Take Tylenol or ibuprofen  first for pain if you are able to take these medications and only use narcotic pain medication for severe pain  not relieved by the Tylenol or Ibuprofen .  Monitor your Tylenol intake to a max of 4,000 mg in a 24 hour period. You can alternate these medications after surgery.   Diet: 1. Low sodium Heart Healthy Diet is recommended but you are cleared to resume your normal (before surgery) diet after your procedure.   2. It is safe to use a laxative, such as Miralax or Colace, if you have difficulty moving your bowels. You will be prescribed Sennakot at bedtime every evening to keep bowel movements regular and to prevent constipation.     Wound Care: 1. Keep clean and dry.  Shower daily.   Reasons to call the Doctor: Fever - Oral temperature greater than 100.4 degrees Fahrenheit Foul-smelling vaginal discharge Difficulty urinating Nausea and vomiting Increased pain at the site of the  incision that is unrelieved with pain medicine. Difficulty breathing with or without chest pain New calf pain especially if only on one side Sudden, continuing increased vaginal bleeding with or without clots.   Contacts: For questions or concerns you should contact:   Dr. Wiley Hanger at [phone]   Vira Grieves, NP at [phone]   After Hours: call [phone] and have the GYN Oncologist paged/contacted (after 5 pm or on the weekends).   Messages sent via mychart are for non-urgent matters and are not responded to after hours so for urgent needs, please call the after hours number.

## 2024-04-18 ENCOUNTER — Telehealth: Payer: Self-pay | Admitting: *Deleted

## 2024-04-18 NOTE — Telephone Encounter (Signed)
 Spoke with patient after she called and left a message stating she needs to move her surgery date from July 23 rd. Pt was offered a surgery date of Thursday, August 7th. Pt agreed to this date and thanked the office for calling back.

## 2024-05-15 ENCOUNTER — Telehealth: Payer: Self-pay | Admitting: Oncology

## 2024-05-15 NOTE — Telephone Encounter (Signed)
 Kristina Lin left a message asking to cancel her surgery with Dr. Viktoria on 06/08/2024.  She has decided to have the surgery done at [facility] where she had her previous surgery for breast cancer. Surgery is scheduled at [facility] on 06/21/2024.

## 2024-05-24 DIAGNOSIS — D072 Carcinoma in situ of vagina: Secondary | ICD-10-CM

## 2024-05-24 DIAGNOSIS — D071 Carcinoma in situ of vulva: Secondary | ICD-10-CM

## 2024-05-24 DIAGNOSIS — D069 Carcinoma in situ of cervix, unspecified: Secondary | ICD-10-CM

## 2024-05-29 ENCOUNTER — Encounter (HOSPITAL_COMMUNITY)

## 2024-06-07 NOTE — Pre-Procedure Assessment (Signed)
    Medication List       * Accurate as of June 07, 2024 10:04 AM. Always use your most recent med list.          MAGNESIUM ORAL Hold Medication: Stop 7 days before surgery   PROBIOTIC ORAL Hold Medication: Stop 7 days before surgery   rosuvastatin 10 mg tablet Commonly known as: Crestor   VITAMIN C ORAL Hold Medication: Stop 7 days before surgery        For outpatient surgery- you MUST have someone to drive you home after surgery due to anesthesia, and it is strongly recommended for someone to stay with you overnight.  Take morning medications as indicated on this form. Hold aspirin and aspirin products (NSAIDs) as per surgeon instructions. Hold vitamins, supplements, diet pills and herbal medicines as per surgeon instructions. Hold blood thinners and anticoagulant medications as per prescribing physician's instructions.  See Enhanced Recovery - Strong Start handout for eating and drinking instructions before surgery. See below for additional instructions.  May wear dentures, but NO bond or paste.  NO makeup or nail polish NO lotion, deodorant, powder or fragrance NO jewelry (including body piercings) NO valuables (including wallet, purse, money, etc) NO contact lenses, may wear glasses  Wear comfortable clothes.  Call your doctor if you develop any problems prior to surgery,  such as fever, rash or severe cold.  Bring inhalers, OK to do nebulizer treatment prior to arrival for surgery.     Additional Instructions:  No food after midnight prior to surgery (including gum and mints) May drink clear liquids until 2 hours before scheduled arrival time. The approved clear liquids include water, flavored water, apple or cranberry juice (no juices if diabetic) and black coffee and tea (nothing added).  Hibiclens shower PM before surgery and AM of surgery Apply provided wipes/cloths PM before surgery, per instruction sheet provided  No alcohol 24 hours prior to  surgery.  If you have sleep apnea, please bring your CPAP machine if you are staying overnight.  Please arrive 1.5 hours prior to scheduled surgery time. If you were given a surgery time by your surgeon's office, please be aware that surgery times change. If your scheduled time has changed, Dr. Ferdinand office will call you 1 business day prior to surgery to inform you of your surgery and arrival times.  On the day of your surgery, please report to the Surgery pavilion in the back of [facility] (main hospital). The Surgery entrance is located around the corner from the Emergency Room and across from the helicopter landing pad.   If you have questions, please call Kenneth at [phone]     *Some images could not be shown.

## 2024-06-08 ENCOUNTER — Ambulatory Visit (HOSPITAL_COMMUNITY): Admit: 2024-06-08 | Admitting: Gynecologic Oncology

## 2024-06-08 DIAGNOSIS — D071 Carcinoma in situ of vulva: Secondary | ICD-10-CM

## 2024-06-08 DIAGNOSIS — D069 Carcinoma in situ of cervix, unspecified: Secondary | ICD-10-CM

## 2024-06-08 DIAGNOSIS — D072 Carcinoma in situ of vagina: Secondary | ICD-10-CM

## 2024-06-08 SURGERY — CONE BIOPSY, CERVIX
Anesthesia: Choice

## 2024-06-21 NOTE — Anesthesia Preprocedure Evaluation (Signed)
 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  Medical History  has a past medical history of Breast cancer (CMS/HCC) (12/03/2010), H/O colonoscopy (09/12/2023), H/O mammogram (first mammograms in 1990, recent 09/1023), HLD (hyperlipidemia), Motion sickness (1995), Osteopenia, PONV (postoperative nausea and vomiting) (approximately 2005), and VIN III (vulvar intraepithelial neoplasia III).  Family History family history is not on file.  Social History  reports that she has never smoked. She has never used smokeless tobacco. She reports current alcohol use of about 5.0 standard drinks of alcohol per week. She reports that she does not use drugs.   Tobacco History  reports that she has never smoked. She has never used smokeless tobacco.  Surgical History  has a past surgical history that includes Mastectomy (12/12/2010) (Right breast); Knee arthroscopy w/ meniscal repair (Left); Bunionectomy (1995 and 2006?) (Two); Breast biopsy (January 2012) (January 2012 - results were interlopulary breast cancer); Cesarean  section, low transverse (August 1981 and July 1982) (2, for both births); Colposcopy (April 2025) (I believe this was part of the biopsy that identified my present condition); and Meniscectomy (2021) (Left knee).   STOP-BANG Total Score: (Patient-Rptd) 2  Physical Exam  Airway Mallampati: II  TM distance: >3 FB    Mouth Opening: >= 3      Cardiovascular - normal exam  Dental - normal exam  Pulmonary - normal exam  Abdominal - normal exam   Neurologic  normal exam         Anesthesia Plan  ASA 2   general    intravenous induction     The patient is not a current smoker. Patient did not smoke on day of procedure. PONV score reviewed. Education provided regarding risk of obstructive sleep apnea. Anesthesia discussed relevant non-opiate alternatives, and their advantages/disadvantages, if applicable for the intended procedure. These alternatives include, but are not limited to, multi-modal non-opiate analgesics. Additional non-opiate alternative modialties are noted on the [place]  Department of Health website or [facility] pamphlet that was provided.   Anesthetic plan and risks discussed with patient. Use of blood products discussed with patient who.   Plan discussed with attending.       Preprocedure Vitals Vitals Value Taken Time  BP 98/64 06/21/24 10:09  Temp 36 C (96.8 F) 06/21/24 10:09  Pulse 77 06/21/24 10:09  Resp 18 06/21/24 10:09  SpO2 100 % 06/21/24 10:09    Pain Score

## 2024-06-21 NOTE — Anesthesia Procedure Notes (Signed)
 Airway Date/Time: 06/21/2024 11:06 AM Urgency: elective  Airway not difficult    General Information and Staff Patient location during procedure: OR Anesthesiologist: Silva Norleen Berg, MDCRNA/CAA: Sherre Slack, CAA    Performed: CRNA/ CAA    Indications and Patient Condition Indications for airway management: anesthesia Sedation level: general anesthesia   Mask difficulty assessment: 0 - not attempted    Dentition: re-evaluated/unchangedInduction type: intravenous    Final Airway Details  Final airway type: supraglottic airway   Successful airway: i-gel Size: 4  Attempts: Attempt(s): #1, completed by: CAA

## 2024-06-29 ENCOUNTER — Encounter: Admitting: Gynecologic Oncology

## 2024-07-04 NOTE — Progress Notes (Signed)
 Patient ID: Kristina Lin is a 68 y.o. female. Referring Physician: Stephane Doffing [address] [place],  [place] [address] Primary Care Provider: Stephane Doffing    Subjective  HPI 68 y.o. female with high grade cervical, vaginal, and vulvar dysplasia as referred by self, here today for 2-week postoperative appointment.  She had a pap smear that was normal in 2023. Her ex-husband has had some infidelity and so she wanted a repeat pap which was done with her gyn Dr. Bobie Cary on 01/13/24 in [place]  where she lives. Pap showed HSIL HPV HR+.  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  She then saw a gyn onc, Dr. Rollo Dollar, in [place]  who recommended a LEEP, surgical excision and CO2 laser of the vulva and vagina. The patient is here for another opinion.  Interval History The patient is a 68 y.o. female with below s/p simple partial vulvectomy of perineum and right vulva, laser ablation of vulva and vagina, CKC on 06/21/24 with Dr. Tinnie hand who presents for a post op visit. There were no immediate postoperative complications.   Today she is feeling well and is without complaints.  Had mild vaginal bleeding and discharge on Sunday -using a panty liner.  She denies incisional bleeding/drainage/swelling, vaginal bleeding, worsening post op pain, issues with bowel/bladder function, CP/SOB, N/V, fevers/chills, or swelling of extremities.  Review of Systems Review of Systems  Constitutional:  Negative for chills, fever and unexpected weight change.  Respiratory:  Negative for shortness of breath.   Cardiovascular:  Negative for chest pain.  Gastrointestinal:  Negative for  abdominal pain, blood in stool, constipation, diarrhea, nausea and vomiting.  Endocrine: Negative for hot flashes.  Genitourinary:  Positive for vaginal bleeding and vaginal discharge. Negative for difficulty urinating, dysuria, hematuria and pelvic pain.   Neurological:  Negative for headaches.  Hematological:  Does not bruise/bleed easily.  Psychiatric/Behavioral:  The patient is not nervous/anxious.             Past Medical History Medical History[1]  Past OBGYN History G2P2002 Mode of delivery: svd Age of menarche: 53 Age of menopause: 75 Last period: 73 Prior use of hormones: denies Prior use of birth control: < 10 yrs Last pap smear: 01/13/24 HSIL HRHPV+ H/o abnormal pap smears/colpo/LEEP: none prior Sexually active:yes H/o sexually transmitted infections: HPV  Past Surgical History Surgical History[2]  Family History Family History[3]  Medications Medications Ordered Prior to Encounter[4]  Allergies Allergies[5]  Past Social History Tobacco use: Tobacco Use History[6] Alcohol use:  Social History   Substance and Sexual Activity  Alcohol Use Yes  . Alcohol/week: 5.0 standard drinks of alcohol  . Types: 5 Glasses of wine per week   Illicit drug use:  Social History   Substance and Sexual Activity  Drug Use Never   Marital Status:  divorced, partner Optometrist Occupation: retired Runner, broadcasting/film/video  Objective  BSA: 1.6 meters squared Visit Vitals BP 104/73  Pulse 65  Temp 36.6 C (97.9 F) (Skin)  Resp 18  Ht 157.5 cm (5' 2.01)  Wt 58.7 kg (129 lb 4.8 oz)  SpO2 100%  BMI 23.64 kg/m  OB Status Postmenopausal  Smoking  Status Never  BSA 1.6 m    Physical Exam Exam conducted with a chaperone present.  Constitutional:      Appearance: Normal appearance.  HENT:     Head: Normocephalic and atraumatic.  Eyes:     Extraocular Movements: Extraocular movements intact.     Pupils: Pupils are equal, round, and reactive to light.  Cardiovascular:     Rate and  Rhythm: Normal rate and regular rhythm.  Pulmonary:     Effort: Pulmonary effort is normal.     Breath sounds: Normal breath sounds.  Abdominal:     General: Abdomen is flat. Bowel sounds are normal. There is no distension.     Palpations: Abdomen is soft. There is no mass.     Tenderness: There is no abdominal tenderness. There is no guarding or rebound.     Hernia: No hernia is present.  Genitourinary:    General: Normal vulva.     Labia:        Right: No rash, tenderness or lesion.        Left: No rash, tenderness or lesion.      Urethra: No urethral pain or urethral lesion.     Vagina: No tenderness.     Cervix: No discharge or lesion.     Comments: CKC site healing as expected -old blood and remaining sutures removed Vaginal apices healing from laser ablation Vulvectomy incision healing as expected.  Sutures remain intact.  No evidence of dehiscence or infection at this time. Musculoskeletal:        General: No swelling.     Cervical back: Normal range of motion and neck supple.  Neurological:     Mental Status: She is alert.     Performance Status:  GRADE ECOG PERFORMANCE STATUS:    0 Fully active, able to carry on all pre-disease performance without restriction       Pain Scale: 0  LABS Lab Results  Component Value Date   WBC 4.50 06/20/2024   HGB 13.2 06/20/2024   HCT 38.2 06/20/2024   MCV 89.7 06/20/2024   PLT 240 06/20/2024   GLUCOSE 91 06/20/2024   CALCIUM 9.9 06/20/2024   NA 137 06/20/2024   K 4.1 06/20/2024   CO2 30 06/20/2024   CL 103 06/20/2024   BUN 19 06/20/2024   CREATININE 1.0 06/20/2024   AST 19 06/20/2024   ALT 16 06/20/2024   No results found for: CA125  I have reviewed the above results.  RADIOLOGY N/A  PATHOLOGY 06/21/2024  Final Diagnosis  A. ENDOCERVIX (CURETTAGE):  -  BENIGN ENDOCERVICAL EPITHELIUM WITH EVIDENCE OF CHRONIC CERVICITIS AND SQUAMOUS METAPLASIA.   -  NO INTRAEPITHELIAL LESION/NEOPLASIA OR MALIGNANCY  IDENTIFIED.   B. 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   -  NO INVASIVE CARCINOMA IDENTIFIED.  -  HSIL/CIN 2 IS PRESENT IN ALL 4 QUADRANTS.  -  FOCAL HSIL/CIN2 PRESENT INVOLVING SUPERFICIAL ENDOCERVICAL GLANDS.  -  MARGINS:      -HSIL/CIN 2 FOCALLY INVOLVES THE ECTOCERVICAL MARGIN OF THE 12:00-3:00 QUADRANT AND 9:00-12:00 QUADRANT.      -THE OTHER MARGINS ARE UNINVOLVED BY HSIL/CIN 2.   C. 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    -  NO INVASIVE CARCINOMA IDENTIFIED.   D. VULVA, LABELED AS RIGHT AT 8 (PARTIAL VULVECTOMY):  -  LOW-GRADE SQUAMOUS INTRAEPITHELIAL LESION/VULVAR INTRAEPITHELIAL NEOPLASIA 1 (LSIL/VIN 1).  -  NO INVASIVE CARCINOMA IDENTIFIED.  -  MARGINS:      -LSIL/VIN 1 IS PRESENT AT THE 6:00 TIP.      -THE OTHER MARGINS ARE UNINVOLVED BY LSIL.   E.  VAGINA, LEFT APEX (BIOPSY):  -  SQUAMOUS MUCOSA WITH REACTIVE-TYPE CHANGES.   -  NO INTRAEPITHELIAL LESION/NEOPLASIA OR INVASIVE CARCINOMA IDENTIFIED.   F.  PERITONEUM (BIOPSY):  -  HIGH-GRADE SQUAMOUS INTRAEPITHELIAL LESION/VULVAR INTRAEPITHELIAL NEOPLASIA 2 (HSIL/VIN 2).   -  NO INVASIVE CARCINOMA IDENTIFIED.  -  MARGINS:      -HSIL/CIN 2 FOCALLY INVOLVES THE 12:00 MARGIN.      -THE OTHER MARGINS ARE UNINVOLVED BY HSIL/VIN 2.    04/11/24  Final Diagnosis  OUTSIDE PATHOLOGY CASE REVIEW [facility], [place], [place]; CASE # [id number], COLLECTED 02/10/2024):   A.  UTERINE CERVIX, 6:00, BIOPSY:  -  HIGH-GRADE SQUAMOUS INTRAEPITHELIAL LESION/CERVICAL INTRAEPITHELIAL NEOPLASIA 2 (HSIL/CIN-2).   B.  VAGINA, RIGHT APEX, BIOPSY:  -  HIGH-GRADE SQUAMOUS INTRAEPITHELIAL LESION/VAGINAL INTRAEPITHELIAL NEOPLASIA 3 (HSIL/VAIN-3).   C.  VAGINA, LEFT APEX, BIOPSY:  -  HIGH-GRADE SQUAMOUS INTRAEPITHELIAL LESION/VAGINAL  INTRAEPITHELIAL NEOPLASIA 3 (HSIL/VAIN-3).   D.  VULVA, RIGHT LABIUM MAJUS, BIOPSY:  -  HIGH-GRADE SQUAMOUS INTRAEPITHELIAL LESION/VULVAR INTRAEPITHELIAL NEOPLASIA 3 (HSIL/VIN-3).    Electronically signed by Lauraine FORBES Humbles, MD on 04/13/2024 at 1422 EDT   I have reviewed the above results.  Assessment & Plan  67 y.o. female with high grade dysplasia of cervix, vagina and vulva  High grade dysplasia of cervix, vagina and vulva - Dr. Lavada has previously discussed the etiology of cervical, vaginal and vulvar dysplasia and its usual association with HPV virus. - 05/08/24 colpo showed high grade dysplasia along the cervix from 3:00-9:00. There are several areas of AW changes along the right perineum (bx proven VIN III), bilateral superior labia and peri-clitoral skin. No clear high grade vaginal dysplasia, though bx proven VAIN III.  - As her most recent biopsies revealed CIN II-III, VIN III, and VAIN III, recommended EUA, CKC/ECC, Vulvar and vaginal CO2 laser ablation, simple partial vulvectomy, simple vaginectomies, and procedures as indicated so that pathologic review can be performed and invasion ruled out.  - Now s/p EUA, CKC/ECC, simple partial vulvectomy of right vulva and perineum, and CO2 laser ablation of vulva and vagina on 06/21/24, Dr. Lavada previously called patient to review final path c/w only high grade dysplasia in the cervix, vagina and vulva - no malignancy. There was CIN III in the cervix with positive margins at 9:00-12:00 and 12:00-3:00. The right vaginal apical biopsy was positive for VAIN III and was laser ablated. Right cuff negative for dysplasia. The vulvectomy was benign on the right and VIN II at the perineum vulvectomy site with positive margins at 12:00 (sent msg to clarify with path). The margins were all laser ablated.  - Again, reviewed final pathology with patient. Copy of report given to patient. - Recovering well from surgery. Tolerating regular diet, ambulating,  voiding without issue, and pain controlled. Off narcotics. - Reviewed postoperative restrictions. No tub baths or swimming x 1 month, no heavy lifting (>10 lbs) x 1 months, nothing in vagina x 1 month. Okay to drive now. - Plan on 3 month follow-up with vulvar colposcopy and then repeat pap at 6 months.  - Reviewed signs/symptoms of recurrence. She is to call us  in the interim prior to next appointment with any persistent or worsening symptoms. - She has received 2/3 HPV vaccines.  - All questions  answered to the best of my ability   HEALTH MAINTAINANCE Last pap smear:   01/13/24 HSIL HRHPV+  RETURN VISIT 3 month cervical, vaginal and vulvar colpo 6 month repeat pap   VISIT ORDERS none      [1] Past Medical History: Diagnosis Date  . Breast cancer (CMS/HCC) 12/03/2010  . H/O colonoscopy 09/12/2023  . H/O mammogram first mammograms in 1990, recent 09/1023  . HLD (hyperlipidemia)   . Motion sickness 1995   Can get sick on long car rides and sea sick  . Osteopenia   . PONV (postoperative nausea and vomiting) approximately 2005   I had difficulty many years ago with my bunionectomies.  For my breast cancer and following surgeries, I was given anti nausea drugs with anesthesia and did very well.  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 BREAST BIOPSY  January 2012   January 2012 - results were interlopulary breast cancer  . BUNIONECTOMY  1995 and 2006?   Two  . CESAREAN SECTION, LOW TRANSVERSE  August 1981 and July 1982   2, for both births  . COLD KNIFE CONIZATION     CKC, vaginal and vulvar CO2 laser ablation, simple partial vulvectomy at 8:00 and perineum  . COLPOSCOPY  April 2025   I believe this was part of the biopsy that identified my present condition  . KNEE ARTHROSCOPY W/ MENISCAL REPAIR Left   . LASER ABLATION  06/21/2024   LASER ABLATION OF VULVA AND VAGINA; SIMPLE PARTIAL VULVECTOMY OF  PERINEUM AND RIGHT VULVA;   COLD KNIFE CONE BIOPSY OF CERVIX  . MASTECTOMY  12/12/2010   Right breast  . MENISCECTOMY  2021   Left knee  . VULVECTOMY    [3] Family History Problem Relation Name Age of Onset  . Colon cancer Neg Hx    . Uterine cancer Neg Hx    . Ovarian cancer Neg Hx    . Breast cancer Neg Hx    [4] Current Outpatient Medications on File Prior to Visit  Medication Sig Dispense Refill  . ascorbic acid (VITAMIN C ORAL) Take by mouth.    . Lactobacillus acidophilus (PROBIOTIC ORAL) Take by mouth.    SABRA MAGNESIUM ORAL Take by mouth.    . ondansetron  (Zofran  ODT) 4 mg disintegrating tablet Take 1 tablet (4 mg) by mouth every 8 (eight) hours if needed for nausea or vomiting. 12 tablet 0  . rosuvastatin (Crestor) 10 mg tablet Take 1 tablet (10 mg) by mouth at bedtime.    . celecoxib (CeleBREX) 200 mg capsule Take 1 capsule (200 mg) by mouth 2 (two) times a day. Take around the clock for first three days after surgery for pain relief, then as needed for mild/moderate pain relief. (Patient not taking: Reported on 07/07/2024) 14 capsule 0  . gabapentin (Neurontin) 300 mg capsule Take 1 capsule (300 mg) by mouth in the morning, at noon, and at bedtime. Use for the first three days after surgery around the clock, then as needed for mild pain. Indications: acute pain following an operation (Patient not taking: Reported on 07/07/2024 Indications: acute pain following an operation) 21 capsule 0   No current facility-administered medications on file prior to visit.  [5] Allergies Allergen Reactions  . Oxycodone-Acetaminophen Nausea And Vomiting, Other, GI intolerance, Nausea and Vomiting   [6] Social History Tobacco Use  Smoking Status Never  Smokeless Tobacco Never

## 2024-09-10 ENCOUNTER — Other Ambulatory Visit: Payer: Self-pay | Admitting: Obstetrics and Gynecology

## 2024-09-11 NOTE — Telephone Encounter (Signed)
.  Med refill request: Macrodantin   Last AEX: 01/13/24 Next AEX:  Last MMG (if hormonal med) Refill authorized: Please Advise?

## 2024-09-11 NOTE — Telephone Encounter (Signed)
 Next Aex not scheduled.  I have sent a message to the front asking them to reach out to get the pt scheduled.   Updated the rx to QTY 30 with No refills  Also added a message on the rx note to pharmacy pt needs an appt.

## 2024-09-14 ENCOUNTER — Ambulatory Visit (HOSPITAL_BASED_OUTPATIENT_CLINIC_OR_DEPARTMENT_OTHER)
Admission: RE | Admit: 2024-09-14 | Discharge: 2024-09-14 | Disposition: A | Discharge status: Home / Self Care | Source: Ambulatory Visit | Attending: Obstetrics and Gynecology | Admitting: Obstetrics and Gynecology

## 2024-09-14 ENCOUNTER — Other Ambulatory Visit

## 2024-09-14 DIAGNOSIS — M8580 Other specified disorders of bone density and structure, unspecified site: Secondary | ICD-10-CM | POA: Diagnosis present

## 2024-09-14 DIAGNOSIS — Z780 Asymptomatic menopausal state: Secondary | ICD-10-CM | POA: Diagnosis present

## 2024-09-15 ENCOUNTER — Ambulatory Visit: Payer: Self-pay | Admitting: Obstetrics and Gynecology

## 2024-09-18 ENCOUNTER — Other Ambulatory Visit: Payer: Self-pay | Admitting: Obstetrics and Gynecology

## 2024-10-16 ENCOUNTER — Inpatient Hospital Stay
Admission: RE | Admit: 2024-10-16 | Discharge: 2024-10-16 | Attending: Obstetrics and Gynecology | Admitting: Obstetrics and Gynecology

## 2024-10-16 DIAGNOSIS — Z1231 Encounter for screening mammogram for malignant neoplasm of breast: Secondary | ICD-10-CM

## 2024-10-20 ENCOUNTER — Ambulatory Visit: Payer: Self-pay | Admitting: Obstetrics and Gynecology

## 2024-11-14 ENCOUNTER — Other Ambulatory Visit: Payer: Self-pay | Admitting: Internal Medicine

## 2024-11-14 DIAGNOSIS — E785 Hyperlipidemia, unspecified: Secondary | ICD-10-CM

## 2024-12-07 ENCOUNTER — Inpatient Hospital Stay: Admission: RE | Admit: 2024-12-07 | Discharge: 2024-12-07 | Attending: Internal Medicine

## 2024-12-07 DIAGNOSIS — E785 Hyperlipidemia, unspecified: Secondary | ICD-10-CM
